# Patient Record
Sex: Male | Born: 2000 | Race: Black or African American | Hispanic: No | Marital: Single | Smoking: Current every day smoker
Health system: Southern US, Community
[De-identification: ages and names within clinical notes are randomized; demographics above are authoritative.]

## PROBLEM LIST (undated history)

## (undated) DIAGNOSIS — J45909 Unspecified asthma, uncomplicated: Secondary | ICD-10-CM

## (undated) HISTORY — PX: DENTAL RESTORATION/EXTRACTION WITH X-RAY: SHX5796

---
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
## 2019-06-27 ENCOUNTER — Encounter (HOSPITAL_COMMUNITY): Payer: Self-pay

## 2019-06-27 ENCOUNTER — Other Ambulatory Visit: Payer: Self-pay

## 2019-06-27 ENCOUNTER — Ambulatory Visit (HOSPITAL_COMMUNITY)
Admission: EM | Admit: 2019-06-27 | Discharge: 2019-06-27 | Disposition: A | Discharge status: Home / Self Care | Payer: Managed Care, Other (non HMO) | Attending: Physician Assistant | Admitting: Physician Assistant

## 2019-06-27 DIAGNOSIS — K0889 Other specified disorders of teeth and supporting structures: Secondary | ICD-10-CM

## 2019-06-27 MED ORDER — DICLOFENAC SODIUM 75 MG PO TBEC: 75.0000 mg | DELAYED_RELEASE_TABLET | Freq: Two times a day (BID) | ORAL | 0 refills | Status: DC

## 2019-06-27 MED ORDER — AMOXICILLIN 500 MG PO CAPS: 500.0000 mg | ORAL_CAPSULE | Freq: Three times a day (TID) | ORAL | 0 refills | Status: DC

## 2019-06-27 NOTE — ED Triage Notes (Signed)
Pt presents with bilateral dental pain from wisdom teeth coming in.

## 2019-06-27 NOTE — Discharge Instructions (Addendum)
Return if any problems.

## 2019-06-27 NOTE — ED Provider Notes (Signed)
[facility]    CSN: [id number] Arrival date & time: 06/27/19  1433      History   Chief Complaint Chief Complaint  Patient presents with  . Dental Pain    HPI Anthony Vance is a 18 y.o. male.   The history is provided by the patient. No language interpreter was used.  Dental Pain Location:  Upper and lower Upper teeth location:  1/RU 3rd molar and 16/LU 3rd molar Lower teeth location:  32/RL 3rd molar and 17/LL 3rd molar Quality:  Aching Severity:  Moderate Onset quality:  Gradual Timing:  Constant Progression:  Worsening Chronicity:  New Relieved by:  Nothing Worsened by:  Nothing Ineffective treatments:  None tried  Pt complains of pain from wisdom teeth.  History reviewed. No pertinent past medical history.  There are no active problems to display for this patient.   History reviewed. No pertinent surgical history.     Home Medications    Prior to Admission medications   Medication Sig Start Date End Date Taking? Authorizing Provider  amoxicillin (AMOXIL) 500 MG capsule Take 1 capsule (500 mg total) by mouth 3 (three) times daily. 06/27/19   Elson AreasSofia, Leslie K, PA-C  benzonatate (TESSALON) 100 MG capsule Take 1 capsule (100 mg total) by mouth every 8 (eight) hours. 10/08/18   Cathie HoopsYu, Amy V, PA-C  diclofenac (VOLTAREN) 75 MG EC tablet Take 1 tablet (75 mg total) by mouth 2 (two) times daily. 06/27/19   Elson AreasSofia, Leslie K, PA-C  doxycycline (VIBRAMYCIN) 100 MG capsule Take 1 capsule (100 mg total) by mouth 2 (two) times daily. 10/08/18   Cathie HoopsYu, Amy V, PA-C  fluticasone (FLONASE) 50 MCG/ACT nasal spray Place 2 sprays into both nostrils daily. 10/08/18   Cathie HoopsYu, Amy V, PA-C  ibuprofen (ADVIL,MOTRIN) 600 MG tablet Take 1 tablet (600 mg total) by mouth every 6 (six) hours as needed. 08/24/17   Joy, Shawn C, PA-C  ipratropium (ATROVENT) 0.06 % nasal spray Place 2 sprays into both nostrils 4 (four) times daily. 10/08/18   Cathie HoopsYu, Amy V, PA-C  predniSONE (DELTASONE) 50 MG tablet  Take 1 tablet (50 mg total) by mouth daily. 10/08/18   Belinda FisherYu, Amy V, PA-C    Family History Family History  Problem Relation Age of Onset  . Hypertension Other   . Diabetes Other     Social History Social History   Tobacco Use  . Smoking status: Passive Smoke Exposure - Never Smoker  . Smokeless tobacco: Never Used  Substance Use Topics  . Alcohol use: No  . Drug use: No     Allergies   Patient has no known allergies.   Review of Systems Review of Systems  HENT: Positive for dental problem.   All other systems reviewed and are negative.    Physical Exam Triage Vital Signs ED Triage Vitals  Enc Vitals Group     BP 06/27/19 1458 127/78     Pulse Rate 06/27/19 1458 60     Resp 06/27/19 1458 18     Temp 06/27/19 1458 98.7 F (37.1 C)     Temp Source 06/27/19 1458 Oral     SpO2 06/27/19 1458 100 %     Weight --      Height --      Head Circumference --      Peak Flow --      Pain Score 06/27/19 1459 10     Pain Loc --      Pain Edu? --  Excl. in GC? --    No data found.  Updated Vital Signs BP 127/78 (BP Location: Right Arm)   Pulse 60   Temp 98.7 F (37.1 C) (Oral)   Resp 18   SpO2 100%   Visual Acuity Right Eye Distance:   Left Eye Distance:   Bilateral Distance:    Right Eye Near:   Left Eye Near:    Bilateral Near:     Physical Exam Vitals signs and nursing note reviewed.  Constitutional:      Appearance: He is well-developed.  HENT:     Head: Normocephalic.     Mouth/Throat:     Mouth: Mucous membranes are moist.     Comments: Swelling around 3rd molars.  Neck:     Musculoskeletal: Normal range of motion.  Cardiovascular:     Rate and Rhythm: Normal rate.  Pulmonary:     Effort: Pulmonary effort is normal.  Abdominal:     General: There is no distension.  Musculoskeletal: Normal range of motion.  Neurological:     Mental Status: He is alert and oriented to person, place, and time.      UC Treatments / Results  Labs  (all labs ordered are listed, but only abnormal results are displayed) Labs Reviewed - No data to display  EKG   Radiology No results found.  Procedures Procedures (including critical care time)  Medications Ordered in UC Medications - No data to display  Initial Impression / Assessment and Plan / UC Course  I have reviewed the triage vital signs and the nursing notes.  Pertinent labs & imaging results that were available during my care of the patient were reviewed by me and considered in my medical decision making (see chart for details).      Final Clinical Impressions(s) / UC Diagnoses   Final diagnoses:  Toothache     Discharge Instructions     Return if any problems    ED Prescriptions    Medication Sig Dispense Auth. Provider   amoxicillin (AMOXIL) 500 MG capsule Take 1 capsule (500 mg total) by mouth 3 (three) times daily. 30 capsule Sofia, Leslie K, [place]   diclofenac (VOLTAREN) 75 MG EC tablet Take 1 tablet (75 mg total) by mouth 2 (two) times daily. 20 tablet Fransico Meadow, [place]     Controlled Substance Prescriptions [place] Controlled Substance Registry consulted? Not Applicable  An After Visit Summary was printed and given to the patient.    Fransico Meadow, [place] 06/27/19 1537

## 2020-05-19 ENCOUNTER — Encounter (HOSPITAL_COMMUNITY): Payer: Self-pay

## 2020-05-19 ENCOUNTER — Other Ambulatory Visit: Payer: Self-pay

## 2020-05-19 ENCOUNTER — Ambulatory Visit (HOSPITAL_COMMUNITY)
Admission: EM | Admit: 2020-05-19 | Discharge: 2020-05-19 | Disposition: A | Discharge status: Home / Self Care | Payer: 59 | Attending: Family Medicine | Admitting: Family Medicine

## 2020-05-19 DIAGNOSIS — G8929 Other chronic pain: Secondary | ICD-10-CM

## 2020-05-19 DIAGNOSIS — M25562 Pain in left knee: Secondary | ICD-10-CM

## 2020-05-19 MED ORDER — NAPROXEN 500 MG PO TABS: 500.0000 mg | ORAL_TABLET | Freq: Two times a day (BID) | ORAL | 0 refills | Status: DC
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
## 2020-05-19 NOTE — Discharge Instructions (Addendum)
Rest, ice, wear the knee sleeve Stretch the leg.  Naproxen for pain Follow up with sports med as needed.

## 2021-02-08 ENCOUNTER — Encounter (HOSPITAL_COMMUNITY): Payer: Self-pay

## 2021-02-08 ENCOUNTER — Emergency Department (HOSPITAL_COMMUNITY)
Admission: EM | Admit: 2021-02-08 | Discharge: 2021-02-09 | Disposition: A | Discharge status: Home / Self Care | Payer: BC Managed Care – PPO | Attending: Emergency Medicine | Admitting: Emergency Medicine

## 2021-02-08 ENCOUNTER — Emergency Department (HOSPITAL_COMMUNITY): Payer: BC Managed Care – PPO

## 2021-02-08 ENCOUNTER — Other Ambulatory Visit: Payer: Self-pay

## 2021-02-08 DIAGNOSIS — R110 Nausea: Secondary | ICD-10-CM | POA: Diagnosis not present

## 2021-02-08 DIAGNOSIS — M549 Dorsalgia, unspecified: Secondary | ICD-10-CM | POA: Diagnosis not present

## 2021-02-08 DIAGNOSIS — Y92410 Unspecified street and highway as the place of occurrence of the external cause: Secondary | ICD-10-CM | POA: Insufficient documentation

## 2021-02-08 DIAGNOSIS — J45909 Unspecified asthma, uncomplicated: Secondary | ICD-10-CM | POA: Diagnosis not present

## 2021-02-08 DIAGNOSIS — S301XXA Contusion of abdominal wall, initial encounter: Secondary | ICD-10-CM | POA: Diagnosis not present

## 2021-02-08 DIAGNOSIS — F17290 Nicotine dependence, other tobacco product, uncomplicated: Secondary | ICD-10-CM | POA: Insufficient documentation

## 2021-02-08 DIAGNOSIS — S3991XA Unspecified injury of abdomen, initial encounter: Secondary | ICD-10-CM | POA: Diagnosis present

## 2021-02-08 DIAGNOSIS — R079 Chest pain, unspecified: Secondary | ICD-10-CM | POA: Insufficient documentation

## 2021-02-08 DIAGNOSIS — S0990XA Unspecified injury of head, initial encounter: Secondary | ICD-10-CM | POA: Insufficient documentation

## 2021-02-08 DIAGNOSIS — R52 Pain, unspecified: Secondary | ICD-10-CM

## 2021-02-08 HISTORY — DX: Unspecified asthma, uncomplicated: J45.909

## 2021-02-08 LAB — HEPATIC FUNCTION PANEL
ALT: 40 U/L (ref 0–44)
AST: 32 U/L (ref 15–41)
Albumin: 4.3 g/dL (ref 3.5–5.0)
Alkaline Phosphatase: 56 U/L (ref 38–126)
Bilirubin, Direct: 0.2 mg/dL (ref 0.0–0.2)
Indirect Bilirubin: 0.2 mg/dL — ABNORMAL LOW (ref 0.3–0.9)
Total Bilirubin: 0.4 mg/dL (ref 0.3–1.2)
Total Protein: 6.8 g/dL (ref 6.5–8.1)

## 2021-02-08 LAB — CBC WITH DIFFERENTIAL/PLATELET
Abs Immature Granulocytes: 0.02 10*3/uL (ref 0.00–0.07)
Basophils Absolute: 0 10*3/uL (ref 0.0–0.1)
Basophils Relative: 1 %
Eosinophils Absolute: 0 10*3/uL (ref 0.0–0.5)
Eosinophils Relative: 0 %
HCT: 50.7 % (ref 39.0–52.0)
Hemoglobin: 17.8 g/dL — ABNORMAL HIGH (ref 13.0–17.0)
Immature Granulocytes: 0 %
Lymphocytes Relative: 38 %
Lymphs Abs: 2.3 10*3/uL (ref 0.7–4.0)
MCH: 32 pg (ref 26.0–34.0)
MCHC: 35.1 g/dL (ref 30.0–36.0)
MCV: 91.2 fL (ref 80.0–100.0)
Monocytes Absolute: 0.3 10*3/uL (ref 0.1–1.0)
Monocytes Relative: 6 %
Neutro Abs: 3.3 10*3/uL (ref 1.7–7.7)
Neutrophils Relative %: 55 %
Platelets: 240 10*3/uL (ref 150–400)
RBC: 5.56 MIL/uL (ref 4.22–5.81)
RDW: 12.3 % (ref 11.5–15.5)
WBC: 6 10*3/uL (ref 4.0–10.5)
nRBC: 0 % (ref 0.0–0.2)

## 2021-02-08 LAB — BASIC METABOLIC PANEL
Anion gap: 6 (ref 5–15)
BUN: 16 mg/dL (ref 6–20)
CO2: 27 mmol/L (ref 22–32)
Calcium: 9.3 mg/dL (ref 8.9–10.3)
Chloride: 105 mmol/L (ref 98–111)
Creatinine, Ser: 1.13 mg/dL (ref 0.61–1.24)
GFR, Estimated: >60 mL/min (ref >60)
Glucose, Bld: 126 mg/dL — ABNORMAL HIGH (ref 70–99)
Potassium: 3.8 mmol/L (ref 3.5–5.1)
Sodium: 138 mmol/L (ref 135–145)

## 2021-02-08 LAB — LIPASE, BLOOD: Lipase: 64 U/L — ABNORMAL HIGH (ref 11–51)

## 2021-02-08 MED ORDER — IOHEXOL 300 MG/ML  SOLN
100.0000 mL | Freq: Once | INTRAMUSCULAR | Status: AC | PRN
  Administered 2021-02-08: 100 mL via INTRAVENOUS

## 2021-02-08 MED ORDER — ONDANSETRON HCL 4 MG/2ML IJ SOLN
4.0000 mg | Freq: Once | INTRAMUSCULAR | Status: AC
  Administered 2021-02-08: 4 mg via INTRAVENOUS
  Filled 2021-02-08: qty 2

## 2021-02-08 MED ORDER — MORPHINE SULFATE (PF) 4 MG/ML IV SOLN
4.0000 mg | Freq: Once | INTRAVENOUS | Status: AC
  Administered 2021-02-08: 4 mg via INTRAVENOUS
  Filled 2021-02-08: qty 1

## 2021-02-08 NOTE — ED Notes (Signed)
Pt transported to radiology.
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
## 2021-02-09 MED ORDER — METHOCARBAMOL 500 MG PO TABS: 500.0000 mg | ORAL_TABLET | Freq: Three times a day (TID) | ORAL | 0 refills | Status: DC | PRN

## 2021-02-09 MED ORDER — IBUPROFEN 800 MG PO TABS: 800.0000 mg | ORAL_TABLET | Freq: Three times a day (TID) | ORAL | 0 refills | Status: AC

## 2021-02-09 NOTE — ED Notes (Signed)
Pt  Has eaten and drunk successfully.  PT walked around the  [place] without assistance.  PT has a significant limp related to rt knee pain.
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
## 2021-02-09 NOTE — ED Notes (Signed)
DC instructions reviewed with pt. PT verbalized understanding. PT DC °

## 2021-02-24 ENCOUNTER — Emergency Department (HOSPITAL_COMMUNITY): Payer: BC Managed Care – PPO

## 2021-02-24 ENCOUNTER — Emergency Department (HOSPITAL_COMMUNITY)
Admission: EM | Admit: 2021-02-24 | Discharge: 2021-02-25 | Disposition: A | Discharge status: Home / Self Care | Payer: BC Managed Care – PPO | Attending: Emergency Medicine | Admitting: Emergency Medicine

## 2021-02-24 DIAGNOSIS — S8392XA Sprain of unspecified site of left knee, initial encounter: Secondary | ICD-10-CM | POA: Insufficient documentation

## 2021-02-24 DIAGNOSIS — M25462 Effusion, left knee: Secondary | ICD-10-CM | POA: Insufficient documentation

## 2021-02-24 DIAGNOSIS — J45909 Unspecified asthma, uncomplicated: Secondary | ICD-10-CM | POA: Diagnosis not present

## 2021-02-24 DIAGNOSIS — F17290 Nicotine dependence, other tobacco product, uncomplicated: Secondary | ICD-10-CM | POA: Diagnosis not present

## 2021-02-24 DIAGNOSIS — Y92410 Unspecified street and highway as the place of occurrence of the external cause: Secondary | ICD-10-CM | POA: Insufficient documentation

## 2021-02-24 DIAGNOSIS — S8992XA Unspecified injury of left lower leg, initial encounter: Secondary | ICD-10-CM | POA: Diagnosis present

## 2021-02-24 MED ORDER — KETOROLAC TROMETHAMINE 60 MG/2ML IM SOLN
60.0000 mg | Freq: Once | INTRAMUSCULAR | Status: AC
  Administered 2021-02-25: 60 mg via INTRAMUSCULAR
  Filled 2021-02-24: qty 2
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
## 2021-02-24 NOTE — Discharge Instructions (Signed)
Continue to take 2 ibuprofen and 2 tylenol at the same time every 6 hours for the pain and wear the brace for support.  Also use the crutches as needed.
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
## 2021-02-25 DIAGNOSIS — S8392XA Sprain of unspecified site of left knee, initial encounter: Secondary | ICD-10-CM | POA: Diagnosis not present

## 2021-02-25 NOTE — ED Notes (Signed)
Patient verbalizes understanding of discharge instructions. Opportunity for questioning and answers were provided. Armband removed by staff, pt discharged from ED ambulatory with crutches. ° °

## 2021-11-11 ENCOUNTER — Other Ambulatory Visit: Payer: Self-pay

## 2021-11-11 ENCOUNTER — Encounter (HOSPITAL_BASED_OUTPATIENT_CLINIC_OR_DEPARTMENT_OTHER): Payer: Self-pay

## 2021-11-11 DIAGNOSIS — J45909 Unspecified asthma, uncomplicated: Secondary | ICD-10-CM | POA: Diagnosis not present

## 2021-11-11 DIAGNOSIS — Z202 Contact with and (suspected) exposure to infections with a predominantly sexual mode of transmission: Secondary | ICD-10-CM | POA: Insufficient documentation

## 2021-11-11 DIAGNOSIS — Z7951 Long term (current) use of inhaled steroids: Secondary | ICD-10-CM | POA: Insufficient documentation

## 2021-11-11 NOTE — ED Triage Notes (Signed)
Pt reports STD exposure-denies sx-NAD-steady gait 

## 2021-11-12 ENCOUNTER — Emergency Department (HOSPITAL_BASED_OUTPATIENT_CLINIC_OR_DEPARTMENT_OTHER)
Admission: EM | Admit: 2021-11-12 | Discharge: 2021-11-12 | Disposition: A | Discharge status: Home / Self Care | Payer: BC Managed Care – PPO | Attending: Emergency Medicine | Admitting: Emergency Medicine

## 2021-11-12 DIAGNOSIS — Z202 Contact with and (suspected) exposure to infections with a predominantly sexual mode of transmission: Secondary | ICD-10-CM | POA: Diagnosis not present

## 2021-11-12 MED ORDER — AZITHROMYCIN 250 MG PO TABS
1000.0000 mg | ORAL_TABLET | Freq: Once | ORAL | Status: AC
  Administered 2021-11-12: 1000 mg via ORAL
  Filled 2021-11-12: qty 4

## 2021-11-12 MED ORDER — CEFTRIAXONE SODIUM 500 MG IJ SOLR
500.0000 mg | Freq: Once | INTRAMUSCULAR | Status: AC
  Administered 2021-11-12: 500 mg via INTRAMUSCULAR
  Filled 2021-11-12: qty 500

## 2021-11-12 NOTE — ED Notes (Signed)
Pt denies any symptoms at this time

## 2021-11-12 NOTE — Discharge Instructions (Signed)
No unprotected sexual activity for the next 2 weeks.  Return to the emergency department if you experience any new and/or concerning symptoms.

## 2021-11-12 NOTE — ED Provider Notes (Signed)
[facility] EMERGENCY DEPARTMENT Provider Note   CSN: [id number] Arrival date & time: 11/11/21  2236     History  Chief Complaint  Patient presents with   Exposure to STD    Anthony Vance is a 21 y.o. male.  Patient is a 21 year old male with history of asthma.  He presents today for STD exposure.  He was told earlier today by his recent sexual contact that she tested positive for gonorrhea and chlamydia.  Patient at this time is having no symptoms.  He denies any rash, discharge, dysuria, or other symptoms, but is requesting treatment for the above.  The history is provided by the patient.  Exposure to STD This is a new problem. The current episode started more than 2 days ago. Nothing aggravates the symptoms. Nothing relieves the symptoms.      Home Medications Prior to Admission medications   Medication Sig Start Date End Date Taking? Authorizing Provider  ibuprofen (ADVIL) 800 MG tablet Take 1 tablet (800 mg total) by mouth 3 (three) times daily. 02/09/21   Mesner, Barbara Cower, MD  ipratropium (ATROVENT) 0.06 % nasal spray Place 2 sprays into both nostrils 4 (four) times daily. 10/08/18   Cathie Hoops, Amy V, PA-C  methocarbamol (ROBAXIN) 500 MG tablet Take 1 tablet (500 mg total) by mouth every 8 (eight) hours as needed for muscle spasms. 02/09/21   Mesner, Barbara Cower, MD  naproxen (NAPROSYN) 500 MG tablet Take 1 tablet (500 mg total) by mouth 2 (two) times daily. 05/19/20   Bast, Gloris Manchester A, NP  fluticasone (FLONASE) 50 MCG/ACT nasal spray Place 2 sprays into both nostrils daily. 10/08/18 05/19/20  Belinda Fisher, PA-C      Allergies    Patient has no known allergies.    Review of Systems   Review of Systems  All other systems reviewed and are negative.  Physical Exam Updated Vital Signs BP (!) 152/74 (BP Location: Left Arm)    Pulse 67    Temp 98.4 F (36.9 C) (Oral)    Resp 16    Ht 5\' 11"  (1.803 m)    Wt 79.4 kg    SpO2 98%    BMI 24.41 kg/m  Physical Exam Vitals and nursing note  reviewed.  Constitutional:      Appearance: Normal appearance.  Pulmonary:     Effort: Pulmonary effort is normal.  Abdominal:     General: There is no distension.     Tenderness: There is no abdominal tenderness.  Genitourinary:    Penis: Normal.      Testes: Normal.  Skin:    General: Skin is warm and dry.  Neurological:     Mental Status: He is alert and oriented to person, place, and time.    ED Results / Procedures / Treatments   Labs (all labs ordered are listed, but only abnormal results are displayed) Labs Reviewed  GC/CHLAMYDIA PROBE AMP ([place]) NOT AT [facility]    EKG None  Radiology No results found.  Procedures Procedures    Medications Ordered in ED Medications  cefTRIAXone (ROCEPHIN) injection 500 mg (has no administration in time range)  azithromycin (ZITHROMAX) tablet 1,000 mg (has no administration in time range)    ED Course/ Medical Decision Making/ A&P  Patient presenting with concerns of STD exposure.  His sexual partner tested positive for chlamydia and gonorrhea.  Patient without symptoms at this time, but will be treated presumptively.  Patient given IM Rocephin and p.o. Zithromax.  Final Clinical Impression(s) /  ED Diagnoses Final diagnoses:  None    Rx / DC Orders ED Discharge Orders     None         Geoffery Lyons, MD 11/12/21 [phone]

## 2021-11-14 LAB — GC/CHLAMYDIA PROBE AMP (~~LOC~~) NOT AT ARMC
Chlamydia: POSITIVE — AB
Comment: NEGATIVE
Comment: NORMAL
Neisseria Gonorrhea: NEGATIVE
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
## 2023-03-02 ENCOUNTER — Ambulatory Visit
Admission: EM | Admit: 2023-03-02 | Discharge: 2023-03-02 | Disposition: A | Discharge status: Home / Self Care | Payer: BC Managed Care – PPO | Attending: Internal Medicine | Admitting: Internal Medicine

## 2023-03-02 DIAGNOSIS — Z113 Encounter for screening for infections with a predominantly sexual mode of transmission: Secondary | ICD-10-CM | POA: Insufficient documentation

## 2023-03-02 NOTE — Discharge Instructions (Signed)
Your STD testing is pending.  Will call if it is abnormal and treat as appropriate.  We ask that you refrain from sexual activity until test results and treatment are complete.  Follow-up with any further concerns.

## 2023-03-02 NOTE — ED Triage Notes (Signed)
Patient states he wants an STD check. Patient denies having any symptoms.

## 2023-03-02 NOTE — ED Provider Notes (Signed)
[facility]    CSN: [id number] Arrival date & time: 03/02/23  1734      History   Chief Complaint Chief Complaint  Patient presents with   STD check    HPI Dylan Traver is a 22 y.o. male.   Patient presents today for routine STD testing.  Denies any exposure or any associated symptoms.     Past Medical History:  Diagnosis Date   Asthma     There are no problems to display for this patient.   History reviewed. No pertinent surgical history.     Home Medications    Prior to Admission medications   Medication Sig Start Date End Date Taking? Authorizing Provider  ibuprofen (ADVIL) 800 MG tablet Take 1 tablet (800 mg total) by mouth 3 (three) times daily. 02/09/21   Mesner, Barbara Cower, MD  ipratropium (ATROVENT) 0.06 % nasal spray Place 2 sprays into both nostrils 4 (four) times daily. 10/08/18   Cathie Hoops, Amy V, PA-C  methocarbamol (ROBAXIN) 500 MG tablet Take 1 tablet (500 mg total) by mouth every 8 (eight) hours as needed for muscle spasms. 02/09/21   Mesner, Barbara Cower, MD  naproxen (NAPROSYN) 500 MG tablet Take 1 tablet (500 mg total) by mouth 2 (two) times daily. 05/19/20   Bast, Gloris Manchester A, NP  fluticasone (FLONASE) 50 MCG/ACT nasal spray Place 2 sprays into both nostrils daily. 10/08/18 05/19/20  Belinda Fisher, PA-C    Family History Family History  Problem Relation Age of Onset   Hypertension Other    Diabetes Other     Social History Social History   Tobacco Use   Smoking status: Every Day    Types: Cigars   Smokeless tobacco: Never   Tobacco comments:    black and milds  Vaping Use   Vaping Use: Never used  Substance Use Topics   Alcohol use: Yes    Comment: occ   Drug use: No     Allergies   Patient has no known allergies.   Review of Systems Review of Systems Per HPI  Physical Exam Triage Vital Signs ED Triage Vitals  Enc Vitals Group     BP 03/02/23 1744 136/82     Pulse Rate 03/02/23 1744 75     Resp 03/02/23 1744 16     Temp  03/02/23 1744 98.2 F (36.8 C)     Temp Source 03/02/23 1744 Oral     SpO2 03/02/23 1744 96 %     Weight --      Height --      Head Circumference --      Peak Flow --      Pain Score 03/02/23 1745 0     Pain Loc --      Pain Edu? --      Excl. in GC? --    No data found.  Updated Vital Signs BP 136/82 (BP Location: Left Arm)   Pulse 75   Temp 98.2 F (36.8 C) (Oral)   Resp 16   SpO2 96%   Visual Acuity Right Eye Distance:   Left Eye Distance:   Bilateral Distance:    Right Eye Near:   Left Eye Near:    Bilateral Near:     Physical Exam Constitutional:      General: He is not in acute distress.    Appearance: Normal appearance. He is not toxic-appearing or diaphoretic.  HENT:     Head: Normocephalic and atraumatic.  Eyes:  Extraocular Movements: Extraocular movements intact.     Conjunctiva/sclera: Conjunctivae normal.  Pulmonary:     Effort: Pulmonary effort is normal.  Genitourinary:    Comments: Deferred with shared decision making.  Self swab performed. Neurological:     General: No focal deficit present.     Mental Status: He is alert and oriented to person, place, and time. Mental status is at baseline.  Psychiatric:        Mood and Affect: Mood normal.        Behavior: Behavior normal.        Thought Content: Thought content normal.        Judgment: Judgment normal.      UC Treatments / Results  Labs (all labs ordered are listed, but only abnormal results are displayed) Labs Reviewed  RPR  HIV ANTIBODY (ROUTINE TESTING W REFLEX)  CYTOLOGY, (ORAL, ANAL, URETHRAL) ANCILLARY ONLY    EKG   Radiology No results found.  Procedures Procedures (including critical care time)  Medications Ordered in UC Medications - No data to display  Initial Impression / Assessment and Plan / UC Course  I have reviewed the triage vital signs and the nursing notes.  Pertinent labs & imaging results that were available during my care of the patient were  reviewed by me and considered in my medical decision making (see chart for details).     Patient is here for routine STD testing so cytology swab, HIV, RPR test pending.  No confirmed exposure to STD, so will await results for any treatment if necessary.  Advised follow-up with any further concerns.  Patient verbalized understanding and was agreeable with plan. Final Clinical Impressions(s) / UC Diagnoses   Final diagnoses:  Screening examination for venereal disease     Discharge Instructions      Your STD testing is pending.  Will call if it is abnormal and treat as appropriate.  We ask that you refrain from sexual activity until test results and treatment are complete.  Follow-up with any further concerns.    ED Prescriptions   None    PDMP not reviewed this encounter.   Gustavus Bryant, [place] 03/02/23 [phone]

## 2023-03-05 LAB — CYTOLOGY, (ORAL, ANAL, URETHRAL) ANCILLARY ONLY
Chlamydia: NEGATIVE
Comment: NEGATIVE
Comment: NEGATIVE
Comment: NORMAL
Neisseria Gonorrhea: NEGATIVE
Trichomonas: NEGATIVE

## 2023-03-05 LAB — HIV ANTIBODY (ROUTINE TESTING W REFLEX): HIV Screen 4th Generation wRfx: NONREACTIVE

## 2023-03-05 LAB — RPR: RPR Ser Ql: NONREACTIVE

## 2023-04-21 ENCOUNTER — Emergency Department (HOSPITAL_COMMUNITY): Payer: 59

## 2023-04-21 ENCOUNTER — Encounter (HOSPITAL_COMMUNITY): Payer: Self-pay | Admitting: *Deleted

## 2023-04-21 ENCOUNTER — Other Ambulatory Visit: Payer: Self-pay

## 2023-04-21 ENCOUNTER — Emergency Department (HOSPITAL_COMMUNITY)
Admission: EM | Admit: 2023-04-21 | Discharge: 2023-04-21 | Disposition: A | Discharge status: Home / Self Care | Payer: 59 | Attending: Emergency Medicine | Admitting: Emergency Medicine

## 2023-04-21 DIAGNOSIS — S82141A Displaced bicondylar fracture of right tibia, initial encounter for closed fracture: Secondary | ICD-10-CM | POA: Insufficient documentation

## 2023-04-21 DIAGNOSIS — J45909 Unspecified asthma, uncomplicated: Secondary | ICD-10-CM | POA: Insufficient documentation

## 2023-04-21 DIAGNOSIS — S8991XA Unspecified injury of right lower leg, initial encounter: Secondary | ICD-10-CM | POA: Diagnosis present

## 2023-04-21 MED ORDER — OXYCODONE-ACETAMINOPHEN 5-325 MG PO TABS
1.0000 | ORAL_TABLET | Freq: Once | ORAL | Status: AC
  Administered 2023-04-21: 1 via ORAL
  Filled 2023-04-21: qty 1

## 2023-04-21 MED ORDER — OXYCODONE-ACETAMINOPHEN 5-325 MG PO TABS: 1.0000 | ORAL_TABLET | Freq: Four times a day (QID) | ORAL | 0 refills | Status: AC | PRN

## 2023-04-21 MED ORDER — KETOROLAC TROMETHAMINE 60 MG/2ML IM SOLN
30.0000 mg | Freq: Once | INTRAMUSCULAR | Status: AC
  Administered 2023-04-21: 30 mg via INTRAMUSCULAR
  Filled 2023-04-21: qty 2

## 2023-04-21 NOTE — ED Notes (Signed)
Patient verbalizes understanding of discharge instructions. Opportunity for questioning and answers were provided. Armband removed by staff, pt discharged from ED. Pt taken to ED waiting room via wheel chair.  

## 2023-04-21 NOTE — Discharge Instructions (Signed)
Keep knee immobilizer on when up and out of bed.  You can put weight on your leg, within the tolerance of your own pain.  Use crutches as needed.  Take ibuprofen and Tylenol for pain.  A prescription for narcotic pain medication was sent to your pharmacy.  Take as needed.  Call either number below to set up a follow-up appointment with an orthopedic doctor for reevaluation.  Return to the emergency department for any new or worsening symptoms of concern.
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
## 2023-05-05 ENCOUNTER — Other Ambulatory Visit: Payer: Self-pay

## 2023-05-05 ENCOUNTER — Ambulatory Visit
Admission: EM | Admit: 2023-05-05 | Discharge: 2023-05-05 | Disposition: A | Discharge status: Home / Self Care | Payer: BC Managed Care – PPO | Attending: Internal Medicine | Admitting: Internal Medicine

## 2023-05-05 ENCOUNTER — Encounter: Payer: Self-pay | Admitting: Emergency Medicine

## 2023-05-05 DIAGNOSIS — S82141A Displaced bicondylar fracture of right tibia, initial encounter for closed fracture: Secondary | ICD-10-CM | POA: Diagnosis not present

## 2023-05-05 MED ORDER — TRAMADOL HCL 50 MG PO TABS: 50.0000 mg | ORAL_TABLET | Freq: Four times a day (QID) | ORAL | 0 refills | Status: DC | PRN
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
## 2023-05-05 NOTE — ED Provider Notes (Signed)
[facility]    CSN: [id number] Arrival date & time: 05/05/23  1314      History   Chief Complaint Chief Complaint  Patient presents with   Leg Pain    HPI Anthony Vance is a 22 y.o. male who was recently diagnosed with right tibial plateau fracture comes to the urgent care with persistent right knee pain.  Patient was advised to follow-up with the orthopedic surgery team but he has not made that appointment yet.  He is ran out of his medication and is complaining of persistent right knee pain.  Pain is throbbing, sharp and constant.  Pain is aggravated by movement but denies any relieving factors.  Mild swelling of the right knee. HPI  Past Medical History:  Diagnosis Date   Asthma     There are no problems to display for this patient.   History reviewed. No pertinent surgical history.     Home Medications    Prior to Admission medications   Medication Sig Start Date End Date Taking? Authorizing Provider  traMADol (ULTRAM) 50 MG tablet Take 1 tablet (50 mg total) by mouth every 6 (six) hours as needed. 05/05/23  Yes Roseann Kees, Britta Mccreedy, MD  ibuprofen (ADVIL) 800 MG tablet Take 1 tablet (800 mg total) by mouth 3 (three) times daily. 02/09/21   Mesner, Barbara Cower, MD  ipratropium (ATROVENT) 0.06 % nasal spray Place 2 sprays into both nostrils 4 (four) times daily. 10/08/18   Cathie Hoops, Amy V, PA-C  methocarbamol (ROBAXIN) 500 MG tablet Take 1 tablet (500 mg total) by mouth every 8 (eight) hours as needed for muscle spasms. 02/09/21   Mesner, Barbara Cower, MD  naproxen (NAPROSYN) 500 MG tablet Take 1 tablet (500 mg total) by mouth 2 (two) times daily. 05/19/20   Bast, Gloris Manchester A, NP  fluticasone (FLONASE) 50 MCG/ACT nasal spray Place 2 sprays into both nostrils daily. 10/08/18 05/19/20  Belinda Fisher, PA-C    Family History Family History  Problem Relation Age of Onset   Hypertension Other    Diabetes Other     Social History Social History   Tobacco Use   Smoking status: Every Day     Types: Cigars   Smokeless tobacco: Never   Tobacco comments:    black and milds  Vaping Use   Vaping Use: Never used  Substance Use Topics   Alcohol use: Yes    Comment: occ   Drug use: No     Allergies   Patient has no known allergies.   Review of Systems Review of Systems As per HPI   Physical Exam Triage Vital Signs ED Triage Vitals  Enc Vitals Group     BP 05/05/23 1327 (!) 156/67     Pulse Rate 05/05/23 1327 100     Resp 05/05/23 1327 18     Temp 05/05/23 1327 98 F (36.7 C)     Temp Source 05/05/23 1327 Oral     SpO2 05/05/23 1327 97 %     Weight --      Height --      Head Circumference --      Peak Flow --      Pain Score 05/05/23 1328 10     Pain Loc --      Pain Edu? --      Excl. in GC? --    No data found.  Updated Vital Signs BP (!) 156/67 (BP Location: Left Arm)   Pulse 100   Temp 98  F (36.7 C) (Oral)   Resp 18   SpO2 97%   Visual Acuity Right Eye Distance:   Left Eye Distance:   Bilateral Distance:    Right Eye Near:   Left Eye Near:    Bilateral Near:     Physical Exam Vitals and nursing note reviewed.  Constitutional:      General: He is in acute distress.     Appearance: He is not ill-appearing, toxic-appearing or diaphoretic.  Cardiovascular:     Rate and Rhythm: Normal rate and regular rhythm.  Pulmonary:     Effort: Pulmonary effort is normal.     Breath sounds: Normal breath sounds.  Musculoskeletal:     Comments: Range of motion of the right knee is limited secondary to pain.  No bruising of the right knee.  Neurological:     Mental Status: He is alert.      UC Treatments / Results  Labs (all labs ordered are listed, but only abnormal results are displayed) Labs Reviewed - No data to display  EKG   Radiology No results found.  Procedures Procedures (including critical care time)  Medications Ordered in UC Medications - No data to display  Initial Impression / Assessment and Plan / UC Course  I  have reviewed the triage vital signs and the nursing notes.  Pertinent labs & imaging results that were available during my care of the patient were reviewed by me and considered in my medical decision making (see chart for details).     1.  Right tibial plateau fracture, closed: Patient is advised to follow-up with orthopedic surgery immediately Tramadol as needed for pain Nonweightbearing until patient follows up with orthopedic surgery If patient's symptoms worsen, he is advised to reach out to orthopedic surgery sooner. Final Clinical Impressions(s) / UC Diagnoses   Final diagnoses:  Tibial plateau fracture, right, closed, initial encounter     Discharge Instructions      Please take medications as directed No weightbearing on the right knee Continue using your crutches Please follow-up with orthopedic surgery as soon as possible If you have worsening symptoms please call the orthopedic surgery office sooner.   ED Prescriptions     Medication Sig Dispense Auth. Provider   traMADol (ULTRAM) 50 MG tablet Take 1 tablet (50 mg total) by mouth every 6 (six) hours as needed. 15 tablet Zain Lankford, Britta Mccreedy, MD      I have reviewed the PDMP during this encounter.   Merrilee Jansky, MD 05/05/23 [phone]

## 2023-05-05 NOTE — Discharge Instructions (Addendum)
Please take medications as directed No weightbearing on the right knee Continue using your crutches Please follow-up with orthopedic surgery as soon as possible If you have worsening symptoms please call the orthopedic surgery office sooner.

## 2023-07-13 ENCOUNTER — Other Ambulatory Visit: Payer: Self-pay

## 2023-07-13 ENCOUNTER — Encounter (HOSPITAL_BASED_OUTPATIENT_CLINIC_OR_DEPARTMENT_OTHER): Payer: Self-pay | Admitting: Orthopedic Surgery

## 2023-07-19 ENCOUNTER — Encounter (HOSPITAL_BASED_OUTPATIENT_CLINIC_OR_DEPARTMENT_OTHER): Payer: Self-pay | Admitting: Orthopedic Surgery

## 2023-07-20 ENCOUNTER — Other Ambulatory Visit: Payer: Self-pay

## 2023-07-20 ENCOUNTER — Encounter (HOSPITAL_BASED_OUTPATIENT_CLINIC_OR_DEPARTMENT_OTHER): Payer: Self-pay | Admitting: Orthopedic Surgery

## 2023-07-20 ENCOUNTER — Encounter (HOSPITAL_BASED_OUTPATIENT_CLINIC_OR_DEPARTMENT_OTHER)
Admission: RE | Disposition: A | Discharge status: Home / Self Care | Payer: Self-pay | Source: Home / Self Care | Attending: Orthopedic Surgery

## 2023-07-20 ENCOUNTER — Ambulatory Visit (HOSPITAL_BASED_OUTPATIENT_CLINIC_OR_DEPARTMENT_OTHER): Payer: 59 | Admitting: Certified Registered Nurse Anesthetist

## 2023-07-20 ENCOUNTER — Ambulatory Visit (HOSPITAL_BASED_OUTPATIENT_CLINIC_OR_DEPARTMENT_OTHER)
Admission: RE | Admit: 2023-07-20 | Discharge: 2023-07-20 | Disposition: A | Discharge status: Home / Self Care | Payer: 59 | Attending: Orthopedic Surgery | Admitting: Orthopedic Surgery

## 2023-07-20 DIAGNOSIS — S83511A Sprain of anterior cruciate ligament of right knee, initial encounter: Secondary | ICD-10-CM | POA: Insufficient documentation

## 2023-07-20 DIAGNOSIS — S83281A Other tear of lateral meniscus, current injury, right knee, initial encounter: Secondary | ICD-10-CM | POA: Insufficient documentation

## 2023-07-20 DIAGNOSIS — S83231A Complex tear of medial meniscus, current injury, right knee, initial encounter: Secondary | ICD-10-CM | POA: Diagnosis not present

## 2023-07-20 DIAGNOSIS — J45909 Unspecified asthma, uncomplicated: Secondary | ICD-10-CM | POA: Insufficient documentation

## 2023-07-20 DIAGNOSIS — F17290 Nicotine dependence, other tobacco product, uncomplicated: Secondary | ICD-10-CM | POA: Insufficient documentation

## 2023-07-20 DIAGNOSIS — X500XXA Overexertion from strenuous movement or load, initial encounter: Secondary | ICD-10-CM | POA: Insufficient documentation

## 2023-07-20 DIAGNOSIS — Y9389 Activity, other specified: Secondary | ICD-10-CM | POA: Diagnosis not present

## 2023-07-20 HISTORY — PX: KNEE ARTHROSCOPY WITH MENISCAL REPAIR: SHX5653

## 2023-07-20 SURGERY — KNEE ARTHROSCOPY WITH ANTERIOR CRUCIATE LIGAMENT (ACL) RECONSTRUCTION WITH HAMSTRING GRAFT
Anesthesia: Regional | Site: Knee | Laterality: Right

## 2023-07-20 MED ORDER — FENTANYL CITRATE (PF) 100 MCG/2ML IJ SOLN
25.0000 ug | INTRAMUSCULAR | Status: DC | PRN
  Administered 2023-07-20 (×3): 50 ug via INTRAVENOUS

## 2023-07-20 MED ORDER — FENTANYL CITRATE (PF) 100 MCG/2ML IJ SOLN
INTRAMUSCULAR | Status: DC | PRN
  Administered 2023-07-20 (×2): 50 ug via INTRAVENOUS

## 2023-07-20 MED ORDER — DEXMEDETOMIDINE HCL IN NACL 80 MCG/20ML IV SOLN
INTRAVENOUS | Status: DC | PRN
Start: 2023-07-20 — End: 2023-07-20
  Administered 2023-07-20 (×6): 4 ug via INTRAVENOUS

## 2023-07-20 MED ORDER — FENTANYL CITRATE (PF) 100 MCG/2ML IJ SOLN
INTRAMUSCULAR | Status: AC
  Filled 2023-07-20: qty 2

## 2023-07-20 MED ORDER — DEXMEDETOMIDINE HCL IN NACL 80 MCG/20ML IV SOLN
INTRAVENOUS | Status: AC
  Filled 2023-07-20: qty 20

## 2023-07-20 MED ORDER — PROPOFOL 10 MG/ML IV BOLUS
INTRAVENOUS | Status: AC
  Filled 2023-07-20: qty 20

## 2023-07-20 MED ORDER — ACETAMINOPHEN 500 MG PO TABS
1000.0000 mg | ORAL_TABLET | Freq: Once | ORAL | Status: AC
  Administered 2023-07-20: 1000 mg via ORAL

## 2023-07-20 MED ORDER — OXYCODONE HCL 5 MG PO TABS
5.0000 mg | ORAL_TABLET | Freq: Once | ORAL | Status: AC | PRN
  Administered 2023-07-20: 5 mg via ORAL

## 2023-07-20 MED ORDER — MIDAZOLAM HCL 2 MG/2ML IJ SOLN
2.0000 mg | Freq: Once | INTRAMUSCULAR | Status: AC
  Administered 2023-07-20: 2 mg via INTRAVENOUS

## 2023-07-20 MED ORDER — OXYCODONE HCL 5 MG PO TABS
ORAL_TABLET | ORAL | Status: AC
  Filled 2023-07-20: qty 1

## 2023-07-20 MED ORDER — GLYCOPYRROLATE 0.2 MG/ML IJ SOLN
INTRAMUSCULAR | Status: AC
  Filled 2023-07-20: qty 1

## 2023-07-20 MED ORDER — ACETAMINOPHEN 160 MG/5ML PO SOLN: 325.0000 mg | ORAL | Status: DC | PRN

## 2023-07-20 MED ORDER — MIDAZOLAM HCL 2 MG/2ML IJ SOLN
INTRAMUSCULAR | Status: AC
  Filled 2023-07-20: qty 2

## 2023-07-20 MED ORDER — LIDOCAINE 2% (20 MG/ML) 5 ML SYRINGE
INTRAMUSCULAR | Status: AC
  Filled 2023-07-20: qty 5

## 2023-07-20 MED ORDER — MIDAZOLAM HCL 2 MG/2ML IJ SOLN
INTRAMUSCULAR | Status: DC | PRN
Start: 2023-07-20 — End: 2023-07-20
  Administered 2023-07-20 (×2): 1 mg via INTRAVENOUS

## 2023-07-20 MED ORDER — ARTIFICIAL TEARS OPHTHALMIC OINT
TOPICAL_OINTMENT | OPHTHALMIC | Status: AC
  Filled 2023-07-20: qty 3.5

## 2023-07-20 MED ORDER — OXYCODONE HCL 5 MG PO TABS: 5.0000 mg | ORAL_TABLET | ORAL | 0 refills | Status: AC | PRN

## 2023-07-20 MED ORDER — CEFAZOLIN SODIUM-DEXTROSE 2-4 GM/100ML-% IV SOLN
2.0000 g | INTRAVENOUS | Status: AC
  Administered 2023-07-20: 2 g via INTRAVENOUS

## 2023-07-20 MED ORDER — KETOROLAC TROMETHAMINE 30 MG/ML IJ SOLN
INTRAMUSCULAR | Status: AC
  Filled 2023-07-20: qty 1

## 2023-07-20 MED ORDER — DEXAMETHASONE SODIUM PHOSPHATE 10 MG/ML IJ SOLN
INTRAMUSCULAR | Status: AC
  Filled 2023-07-20: qty 1

## 2023-07-20 MED ORDER — ACETAMINOPHEN 500 MG PO TABS
ORAL_TABLET | ORAL | Status: AC
  Filled 2023-07-20: qty 2

## 2023-07-20 MED ORDER — OXYCODONE HCL 5 MG/5ML PO SOLN: 5.0000 mg | Freq: Once | ORAL | Status: AC | PRN

## 2023-07-20 MED ORDER — DEXAMETHASONE SODIUM PHOSPHATE 10 MG/ML IJ SOLN
INTRAMUSCULAR | Status: DC | PRN
Start: 2023-07-20 — End: 2023-07-20
  Administered 2023-07-20: 10 mg via INTRAVENOUS

## 2023-07-20 MED ORDER — DROPERIDOL 2.5 MG/ML IJ SOLN: 0.6250 mg | Freq: Once | INTRAMUSCULAR | Status: DC | PRN

## 2023-07-20 MED ORDER — FENTANYL CITRATE (PF) 100 MCG/2ML IJ SOLN
100.0000 ug | Freq: Once | INTRAMUSCULAR | Status: AC
  Administered 2023-07-20: 50 ug via INTRAVENOUS

## 2023-07-20 MED ORDER — ONDANSETRON HCL 4 MG PO TABS: 4.0000 mg | ORAL_TABLET | Freq: Three times a day (TID) | ORAL | 0 refills | Status: AC | PRN

## 2023-07-20 MED ORDER — LACTATED RINGERS IV SOLN: INTRAVENOUS | Status: DC

## 2023-07-20 MED ORDER — ONDANSETRON HCL 4 MG/2ML IJ SOLN
INTRAMUSCULAR | Status: DC | PRN
Start: 2023-07-20 — End: 2023-07-20
  Administered 2023-07-20: 4 mg via INTRAVENOUS

## 2023-07-20 MED ORDER — ACETAMINOPHEN 325 MG PO TABS: 325.0000 mg | ORAL_TABLET | ORAL | Status: DC | PRN

## 2023-07-20 MED ORDER — ONDANSETRON HCL 4 MG/2ML IJ SOLN
INTRAMUSCULAR | Status: AC
  Filled 2023-07-20: qty 2

## 2023-07-20 MED ORDER — PROMETHAZINE HCL 25 MG/ML IJ SOLN: 6.2500 mg | INTRAMUSCULAR | Status: DC | PRN

## 2023-07-20 MED ORDER — CEFAZOLIN SODIUM-DEXTROSE 2-4 GM/100ML-% IV SOLN
INTRAVENOUS | Status: AC
  Filled 2023-07-20: qty 100

## 2023-07-20 MED ORDER — SODIUM CHLORIDE 0.9 % IR SOLN
Status: DC | PRN
  Administered 2023-07-20: 9000 mL

## 2023-07-20 MED ORDER — BUPIVACAINE-EPINEPHRINE (PF) 0.5% -1:200000 IJ SOLN
INTRAMUSCULAR | Status: DC | PRN
Start: 2023-07-20 — End: 2023-07-20
  Administered 2023-07-20: 30 mL via PERINEURAL

## 2023-07-20 MED ORDER — KETOROLAC TROMETHAMINE 30 MG/ML IJ SOLN
INTRAMUSCULAR | Status: DC | PRN
Start: 2023-07-20 — End: 2023-07-20
  Administered 2023-07-20: 30 mg via INTRAVENOUS

## 2023-07-20 MED ORDER — GLYCOPYRROLATE 0.2 MG/ML IJ SOLN
INTRAMUSCULAR | Status: DC | PRN
Start: 2023-07-20 — End: 2023-07-20
  Administered 2023-07-20 (×2): .1 mg via INTRAVENOUS

## 2023-07-20 MED ORDER — ARTIFICIAL TEARS OPHTHALMIC OINT
TOPICAL_OINTMENT | OPHTHALMIC | Status: DC | PRN
  Administered 2023-07-20: 1 via OPHTHALMIC

## 2023-07-20 MED ORDER — ACETAMINOPHEN 10 MG/ML IV SOLN: 1000.0000 mg | Freq: Once | INTRAVENOUS | Status: DC | PRN

## 2023-07-20 MED ORDER — LIDOCAINE HCL (CARDIAC) PF 100 MG/5ML IV SOSY
PREFILLED_SYRINGE | INTRAVENOUS | Status: DC | PRN
  Administered 2023-07-20: 40 mg via INTRATRACHEAL

## 2023-07-20 MED ORDER — PROPOFOL 10 MG/ML IV BOLUS
INTRAVENOUS | Status: DC | PRN
Start: 2023-07-20 — End: 2023-07-20
  Administered 2023-07-20: 200 mg via INTRAVENOUS
  Administered 2023-07-20 (×2): 100 mg via INTRAVENOUS

## 2023-07-20 SURGICAL SUPPLY — 78 items
ANCHOR AUTOGRAFT GRAFTLINK CP2 (Anchor) IMPLANT
ANCHOR BUTTON TIGHTROPE 14 (Anchor) IMPLANT
BANDAGE ESMARK 6X9 LF (GAUZE/BANDAGES/DRESSINGS) IMPLANT
BLADE SHAVER TORPEDO 4X13 (MISCELLANEOUS) ×1 IMPLANT
BLADE SURG 15 STRL LF DISP TIS (BLADE) ×2 IMPLANT
BLADE SURG 15 STRL SS (BLADE) ×2
BNDG CMPR 6 X 5 YARDS HK CLSR (GAUZE/BANDAGES/DRESSINGS) ×1
BNDG CMPR 9X6 STRL LF SNTH (GAUZE/BANDAGES/DRESSINGS)
BNDG ELASTIC 6INX 5YD STR LF (GAUZE/BANDAGES/DRESSINGS) ×1 IMPLANT
BNDG ESMARK 6X9 LF (GAUZE/BANDAGES/DRESSINGS)
BURR OVAL 8 FLU 4.0X13 (MISCELLANEOUS) ×1 IMPLANT
BURR OVAL 8 FLU 5.0X13 (MISCELLANEOUS) IMPLANT
CLSR STERI-STRIP ANTIMIC 1/2X4 (GAUZE/BANDAGES/DRESSINGS) ×1 IMPLANT
COOLER ICEMAN CLASSIC (MISCELLANEOUS) ×1 IMPLANT
COVER BACK TABLE 60X90IN (DRAPES) ×1 IMPLANT
CUFF TOURN SGL QUICK 34 (TOURNIQUET CUFF) ×2
CUFF TRNQT CYL 34X4.125X (TOURNIQUET CUFF) ×1 IMPLANT
CUFF TRNQT CYL 34X4X40X1 (TOURNIQUET CUFF) ×1 IMPLANT
CUTTER BONE 4.0MM X 13CM (MISCELLANEOUS) ×1 IMPLANT
CUTTER TENSIONER SUT 2-0 0 FBW (INSTRUMENTS) IMPLANT
DRAPE INCISE IOBAN 66X45 STRL (DRAPES) IMPLANT
DRAPE U-SHAPE 47X51 STRL (DRAPES) ×1 IMPLANT
DRAPE-T ARTHROSCOPY W/POUCH (DRAPES) ×1 IMPLANT
DRILL FLIPCUTTER III 6-12 (ORTHOPEDIC DISPOSABLE SUPPLIES) IMPLANT
DURAPREP 26ML APPLICATOR (WOUND CARE) ×1 IMPLANT
ELECT REM PT RETURN 9FT ADLT (ELECTROSURGICAL) ×1
ELECTRODE REM PT RTRN 9FT ADLT (ELECTROSURGICAL) ×1 IMPLANT
FIBERSTICK 2 (SUTURE) IMPLANT
FLIPCUTTER III 6-12 AR-1204FF (ORTHOPEDIC DISPOSABLE SUPPLIES)
GAUZE PAD ABD 8X10 STRL (GAUZE/BANDAGES/DRESSINGS) ×1 IMPLANT
GAUZE SPONGE 4X4 12PLY STRL (GAUZE/BANDAGES/DRESSINGS) ×1 IMPLANT
GAUZE XEROFORM 1X8 LF (GAUZE/BANDAGES/DRESSINGS) ×1 IMPLANT
GLOVE BIO SURGEON STRL SZ7.5 (GLOVE) ×2 IMPLANT
GLOVE BIOGEL PI IND STRL 8 (GLOVE) ×2 IMPLANT
GOWN STRL REUS W/ TWL LRG LVL3 (GOWN DISPOSABLE) ×1 IMPLANT
GOWN STRL REUS W/ TWL XL LVL3 (GOWN DISPOSABLE) ×1 IMPLANT
GOWN STRL REUS W/TWL LRG LVL3 (GOWN DISPOSABLE) ×1
GOWN STRL REUS W/TWL XL LVL3 (GOWN DISPOSABLE) ×3 IMPLANT
IMP SYS 2ND FIX PEEK 4.75X19.1 (Miscellaneous) ×1 IMPLANT
IMPL SYS 2ND FX PEEK 4.75X19.1 (Miscellaneous) IMPLANT
KNIFE GRAFT ACL 10MM 5952 (MISCELLANEOUS) IMPLANT
KNIFE GRAFT ACL 9MM (MISCELLANEOUS) IMPLANT
MANIFOLD NEPTUNE II (INSTRUMENTS) ×1 IMPLANT
NDL SUT 2-0 SCORPION KNEE (NEEDLE) IMPLANT
NEEDLE SUT 2-0 SCORPION KNEE (NEEDLE)
PACK ARTHROSCOPY DSU (CUSTOM PROCEDURE TRAY) ×1 IMPLANT
PACK BASIN DAY SURGERY FS (CUSTOM PROCEDURE TRAY) ×1 IMPLANT
PAD COLD SHLDR WRAP-ON (PAD) ×1 IMPLANT
PENCIL SMOKE EVACUATOR (MISCELLANEOUS) IMPLANT
SHEET MEDIUM DRAPE 40X70 STRL (DRAPES) ×1 IMPLANT
SLEEVE SCD COMPRESS KNEE MED (STOCKING) ×1 IMPLANT
SPONGE T-LAP 4X18 ~~LOC~~+RFID (SPONGE) ×1 IMPLANT
SUCTION TUBE FRAZIER 10FR DISP (SUCTIONS) ×1 IMPLANT
SUT 2 FIBERLOOP 20 STRT BLUE (SUTURE)
SUT ETHILON 4 0 PS 2 18 (SUTURE) ×1 IMPLANT
SUT FIBERWIRE #2 38 REV NDL BL (SUTURE)
SUT FIBERWIRE #2 38 T-5 BLUE (SUTURE)
SUT MNCRL AB 3-0 PS2 18 (SUTURE) ×1 IMPLANT
SUT MON AB 2-0 CT1 36 (SUTURE) ×1 IMPLANT
SUT PDS AB 0 CT 36 (SUTURE) IMPLANT
SUT VIC AB 0 CT1 27 (SUTURE) ×3
SUT VIC AB 0 CT1 27XBRD ANBCTR (SUTURE) IMPLANT
SUT VIC AB 1 CT1 27 (SUTURE)
SUT VIC AB 1 CT1 27XBRD ANBCTR (SUTURE) IMPLANT
SUT VIC AB 2-0 CT1 27 (SUTURE) ×1
SUT VIC AB 2-0 CT1 TAPERPNT 27 (SUTURE) ×1 IMPLANT
SUT VICRYL 0 UR6 27IN ABS (SUTURE) IMPLANT
SUTURE 2 FIBERLOOP 20 STRT BLU (SUTURE) IMPLANT
SUTURE FIBERWR #2 38 T-5 BLUE (SUTURE) IMPLANT
SUTURE FIBERWR#2 38 REV NDL BL (SUTURE) IMPLANT
SUTURE TAPE TIGERLINK 1.3MM BL (SUTURE) IMPLANT
SUTURETAPE TIGERLINK 1.3MM BL (SUTURE)
TAPE LABRALWHITE 1.5X36 (TAPE) IMPLANT
TOWEL GREEN STERILE FF (TOWEL DISPOSABLE) ×2 IMPLANT
TUBE CONNECTING 20X1/4 (TUBING) IMPLANT
TUBE SUCTION HIGH CAP CLEAR NV (SUCTIONS) ×1 IMPLANT
TUBING ARTHROSCOPY IRRIG 16FT (MISCELLANEOUS) ×1 IMPLANT
WAND ABLATOR APOLLO I90 (BUR) IMPLANT
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
## 2023-07-20 NOTE — Anesthesia Preprocedure Evaluation (Addendum)
Anesthesia Evaluation  Patient identified by MRN, date of birth, ID band Patient awake    Reviewed: Allergy & Precautions, NPO status , Patient's Chart, lab work & pertinent test results  Airway Mallampati: II  TM Distance: >3 FB Neck ROM: Full    Dental  (+) Teeth Intact, Dental Advisory Given   Pulmonary asthma , Current Smoker and Patient abstained from smoking.   breath sounds clear to auscultation       Cardiovascular negative cardio ROS  Rhythm:Regular Rate:Normal     Neuro/Psych negative neurological ROS  negative psych ROS   GI/Hepatic negative GI ROS, Neg liver ROS,,,  Endo/Other  negative endocrine ROS    Renal/GU negative Renal ROS     Musculoskeletal negative musculoskeletal ROS (+)    Abdominal   Peds  Hematology negative hematology ROS (+)   Anesthesia Other Findings   Reproductive/Obstetrics                             Anesthesia Physical Anesthesia Plan  ASA: 2  Anesthesia Plan: General   Post-op Pain Management: Regional block*, Tylenol PO (pre-op)* and Toradol IV (intra-op)*   Induction: Intravenous  PONV Risk Score and Plan: 2 and Ondansetron, Dexamethasone and Midazolam  Airway Management Planned: LMA  Additional Equipment: None  Intra-op Plan:   Post-operative Plan: Extubation in OR  Informed Consent: I have reviewed the patients History and Physical, chart, labs and discussed the procedure including the risks, benefits and alternatives for the proposed anesthesia with the patient or authorized representative who has indicated his/her understanding and acceptance.     Dental advisory given  Plan Discussed with: CRNA  Anesthesia Plan Comments:        Anesthesia Quick Evaluation

## 2023-07-20 NOTE — Transfer of Care (Signed)
Immediate Anesthesia Transfer of Care Note  Patient: Anthony Vance  Procedure(s) Performed: KNEE ARTHROSCOPY WITH ANTERIOR CRUCIATE LIGAMENT (ACL) RECONSTRUCTION WITH HAMSTRING AUTOGRAFT (Right: Knee) KNEE ARTHROSCOPY WITH MEDIAL AND LATERAL MENISECTOMY (Right: Knee)  Patient Location: PACU  Anesthesia Type:General and Regional  Level of Consciousness: drowsy and pateint uncooperative  Airway & Oxygen Therapy: Patient Spontanous Breathing and Patient connected to face mask oxygen  Post-op Assessment: Report given to RN, Patient moving all extremities, and Patient moving all extremities X 4  Post vital signs: Reviewed and stable  Last Vitals:  Vitals Value Taken Time  BP 137/88 07/20/23 1128  Temp 36.2 C 07/20/23 1127  Pulse 84 07/20/23 1129  Resp 14 07/20/23 1129  SpO2 98 % 07/20/23 1129  Vitals shown include unfiled device data.  Last Pain:  Vitals:   07/20/23 1127  TempSrc:   PainSc: Asleep         Complications: No notable events documented.

## 2023-07-20 NOTE — Discharge Instructions (Addendum)
No tylenol until 2:15 p.m. No ibuprofen until 5:30 p.m.   DISCHARGE INSTRUCTIONS: ________________________________________________________________________________ ACL RECONSTRUCTION HOME EXERCISE PROGRAM (0-2 WEEKS)   Elevate the leg above your heart as often as possible. Weight bear as tolerated with the Bledsoe brace, use crutches as needed, progress from 2 to 1 crutch as able using one crutch on opposite side of surgical knee.  Do not limp and do not walk too much!! You should sleep in the knee brace with it locked in full extension.  He may remove for showering.  Otherwise he may remove for exercise. Start normal showering on postoperative day #3.  Do not submerge underwater Goals for first two weeks:  minimal swelling, motion 0-90, walking with brace without crutches and positive attitude about PT. Use pain medication as needed.  You may also take Tylenol and Advil around-the-clock in alternating fashion in addition to the pain medication.  To prevent constipation use Colace 100mg . twice a day while on pain medication.  If constipated, use Miralax 17 gm once a day and drink plenty of fluids.  These medications can be obtained at the pharmacy without a prescription.   Follow up in the office in 14 days. You may remove your postoperative bandages on the third day from surgery and begin showering.  Do not remove the Steri-Strips.  Do not submerge underwater.  Replace your Ace bandage over your wounds before reapplying your knee brace You should also continue to wear the TED hose for 2 weeks postoperatively. You should also take an 81 mg aspirin once per day x 6 weeks for the prevention of DVT.  Regional Anesthesia Blocks  1. You may not be able to move or feel the "blocked" extremity after a regional anesthetic block. This may last may last from 3-48 hours after placement, but it will go away. The length of time depends on the medication injected and your individual response to the medication.  As the nerves start to wake up, you may experience tingling as the movement and feeling returns to your extremity. If the numbness and inability to move your extremity has not gone away after 48 hours, please call your surgeon.   2. The extremity that is blocked will need to be protected until the numbness is gone and the strength has returned. Because you cannot feel it, you will need to take extra care to avoid injury. Because it may be weak, you may have difficulty moving it or using it. You may not know what position it is in without looking at it while the block is in effect.  3. For blocks in the legs and feet, returning to weight bearing and walking needs to be done carefully. You will need to wait until the numbness is entirely gone and the strength has returned. You should be able to move your leg and foot normally before you try and bear weight or walk. You will need someone to be with you when you first try to ensure you do not fall and possibly risk injury.  4. Bruising and tenderness at the needle site are common side effects and will resolve in a few days.  5. Persistent numbness or new problems with movement should be communicated to the surgeon or the [facility] [phone] [facility] [phone]). Post Anesthesia Home Care Instructions  Activity: Get plenty of rest for the remainder of the day. A responsible individual must stay with you for 24 hours following the procedure.  For the next  24 hours, DO NOT: -Drive a car -Advertising copywriter -Drink alcoholic beverages -Take any medication unless instructed by your physician -Make any legal decisions or sign important papers.  Meals: Start with liquid foods such as gelatin or soup. Progress to regular foods as tolerated. Avoid greasy, spicy, heavy foods. If nausea and/or vomiting occur, drink only clear liquids until the nausea and/or vomiting subsides. Call your physician if vomiting  continues.  Special Instructions/Symptoms: Your throat may feel dry or sore from the anesthesia or the breathing tube placed in your throat during surgery. If this causes discomfort, gargle with warm salt water. The discomfort should disappear within 24 hours.  If you had a scopolamine patch placed behind your ear for the management of post- operative nausea and/or vomiting:  1. The medication in the patch is effective for 72 hours, after which it should be removed.  Wrap patch in a tissue and discard in the trash. Wash hands thoroughly with soap and water. 2. You may remove the patch earlier than 72 hours if you experience unpleasant side effects which may include dry mouth, dizziness or visual disturbances. 3. Avoid touching the patch. Wash your hands with soap and water after contact with the patch.

## 2023-07-20 NOTE — Anesthesia Procedure Notes (Signed)
Procedure Name: LMA Insertion Date/Time: 07/20/2023 9:35 AM  Performed by: Salomon Mast, CRNAPre-anesthesia Checklist: Patient identified, Emergency Drugs available, Suction available and Patient being monitored Patient Re-evaluated:Patient Re-evaluated prior to induction Oxygen Delivery Method: Circle system utilized Preoxygenation: Pre-oxygenation with 100% oxygen Induction Type: IV induction Ventilation: Mask ventilation without difficulty LMA: LMA inserted LMA Size: 4.0 Number of attempts: 1 Placement Confirmation: positive ETCO2 and breath sounds checked- equal and bilateral Tube secured with: Tape

## 2023-07-20 NOTE — Progress Notes (Signed)
Instructed pt & family that next dose of Oxycodone due at 4:30 pm if needed today.

## 2023-07-20 NOTE — Op Note (Signed)
Surgery Date: @DATE @    Surgeon(s): Aundria Rud, Noah Delaine, MD   ASSIST: Dion Saucier, PA-C  Assistant attestation:  PA Mcclung was present for the entire procedure.   Implants:  Arthrex all inside cortical buttons on femur and tibia 4.75 PEEK swivel lock x 1.   ANESTHESIA:  general, and adductor block   IV FLUIDS AND URINE: See anesthesia.   TOURNIQUET: 80 minutes    DRAINS: none   COMPLICATIONS: None.     ESTIMATED BLOOD LOSS: minimal   PREOPERATIVE DIAGNOSES:  1.  Right knee medial and lateral meniscus tear 2.  Right knee complete ACL rupture   POSTOPERATIVE DIAGNOSES:  same   PROCEDURES PERFORMED:  1.  Right knee arthroscopy with Hamstring autograft ACL reconstruction 2.  Right knee arthroscopic partial lateral and medial meniscectomy     DESCRIPTION OF PROCEDURE:  Anthony Vance is a pleasant 22 year old male here today with right knee Lateral meniscus tear, Complete ACL rupture, partial MCL tear, complex medial meniscus tear.  They sustained these injuries about 2 months prior to coming to the operating room today.  After a short period of prehabilitation to allow for return of ROM and quadriceps strenght, we discussed proceeding with arthroscopically assisted hamstring autograft ACL reconstruction and possible meniscectomy versus repair.  We reviewed the risks benefits and indications of this procedure including but not limited to bleeding, infection, damage to neurovascular structures, need for future surgery, developed an of arthrosis, rupture of graft, continued instability of the knee, and developement of blood clots and risk of anesthesia.  All questions answered.   The patient was identified in the preoperative holding area and the operative extremity was marked. The patient was brought to the operating room and transferred to operating table in a supine position. Satisfactory general anesthesia was induced by anesthesiology.     Examination under  anesthesia revealed a grade 2B Lachman, grade 2 pivot shift, and stable to varus and valgus stress.    The procedure was initiated by obtaining a hamstring graft. An Esmarch was used to wrap out the leg and the tourniquet was raised for a short time. A 3-inch incision was created over the pes anserine. Dissection was carried out to the level of the sartorius, which was divided above the gracilis tendon, then everted to reveal the semitendinosus tendon which was released distally, tagged and stripped per usual.   The sartorius and gracilis were then repaired back to their insertion with heavy Vicryl suture, that was later incorporated into the swivel lock anchor.   At the back table, we next prepared the graft by removing muscle and tenosynovium. The semitendinosis graft were was cut to length and quadrupled, looping around the Arthrex ACL TightRope for the femoral side and ABS tightrope for the tibial side. The two free ends of the autograft were secured to each other at the tibial side with #2 interlocking FiberWire sutures.  The remainder of the graft was then circumferentially secured according to the manufacturer's recommendations with a 0 FiberWire suture twice at the tibial side and once at the femoral side. The graft was pretensioned on the back table and wrapped in a saline-soaked gauze.  The graft measured 70 mm in total length, 9.5 mm on femoral tunnel diameter, and 9.5 mm diameter on the tibial tunnel.   Standard anterolateral, anteromedial arthroscopy portals were obtained. The anteromedial portal was obtained with a spinal needle for localization under direct visualization with subsequent diagnostic findings.    Anteromedial and anterolateral chambers: mild  synovitis. The synovitis was debrided with a 4.5 mm full radius shaver through both the anteromedial and lateral portals.    Suprapatellar pouch and gutters: no synovitis or debris. Patella chondral surface: Grade 0 Trochlear chondral  surface: Grade 0 Patellofemoral tracking: Midline, no tilt Medial meniscus: Complex tear with oblique tear of the posterior horn.  The posterior root was attached just deep to that oblique tear which did involve about 50% of the posterior root.  This propagated around to the junction of the middle and posterior third of the meniscus.  There was also unstable horizontal tear noted on the residual posterior horn.  This oblique/parrot-beak tear did not go all the way across to the capsule.  Surface Medial femoral condyle flexion bearing surface: Grade 0 Medial femoral condyle extension bearing surface: Grade 0 Medial tibial plateau: Grade 0 Anterior cruciate ligament:Complete mid substance tear Posterior cruciate ligament:stable Lateral meniscus: White zone posterior horn radial tear..   Lateral femoral condyle flexion bearing surface: Grade 0 Lateral femoral condyle extension bearing surface: Just at the level of the sulcus terminalis and perhaps a bit more distal along the weightbearing axis there was a area of approximately 2.5 x 2.5 cm of impaction injury with some good fibrocartilage healing.  This appeared to have been full-thickness probably at the time of the injury but was healing over nicely. Lateral tibial plateau: Grade 0   Next, We turned our attention to the medial meniscus tear.  Utilizing combination of meniscal biter as well as motorized shaver we remove the flat fragment of this parrot-beak tear and then utilized the biter to also remove the tibial side of the horizontal tear posterior horn.  We then smoothed this with motorized shaver.   The lateral meniscus was inspected closely and found to have a white zone radial tear about 2 mm lateral to the posterior root attachment.  This did not complete through the entire meniscus and did not propagate to the capsule attachment.  There was a unstable piece of meniscal tissue that was debrided back with the motorized shaver until it was  stable.  This completed the lateral partial meniscectomy.  We did also perform a chondroplasty of the lateral tibial plateau underneath this tear.  This was performed utilizing the motorized shaver as well.   Next, the ACL reconstruction was undertaken. The ACL stump was removed with thermal ablation and shaver and anatomic bony landmarks were marked for the placement of the femoral and tibial sockets.  Arthrex retroguides and Flipcutters were used to create the sockets and perform the procedure by an all-inside GraftLink technique.  The femoral socket was created at the inferior portion of the bifurcate ridge of the lateral femoral wall with a size 9.5 mm FlipCutter to a depth of  15 mm while the tibial socket was created at the center of the ACL footprint from front to back and toward the base of the medial tibial eminence from medial to lateral, to a depth of 23-25 mm with a 9.5 mm FlipCutter. Bony debris was removed and the edges of socket apertures were smoothed. Suture shuttles were used to deliver the graft into the femoral socket first and the tibial socket second. The graft was then secured within the sockets, cinching the self-locking sutures overtop of the proximal and distal cortical buttons with the knee in a reduced position maintained at 20 degrees flexion while a moderate force posterior drawer was applied.  After this preliminary tensioning, the knee was placed through several flexion-extension cycles  to eliminate any graft settling or excursion and the graft was re-tensioned in the same manner and the sutures were tied over top of the buttons proximally and distally, and the four tibial sided suture arms were secondarily secured at the proximal tibia with 1 SwiveLock anchor. Of note we did also pass a free labral tape through the ACL fixation as an separate internal brace backup fixation.   Final images of the ACL graft were obtained, revealing no lateral wall or roof impingement of the graft at  the notch through range of motion.  Stability of the ACL graft was assessed and found to be normalized at grade 0 lachman and grade 0 pivot shift.    The wounds were all closed in layers per usual.  Dressings were applied and a brace placed And locked in 0 of flexion..  There were no apparent complications.  The patient was awakened and taken to recovery room in satisfactory condition.   POSTOPERATIVE PLAN:  Dmario Gangwer will be touch down weight bearing on crutches until cleared by The therapist.  They will likewise be in The knee brace with it locked until quad function is normalized. They will be on 81 mg asa daily for 6 weeks for DVT PPX.  they will return to the clinic to see the surgeon in 2 weeks.   Anthony Vance

## 2023-07-20 NOTE — Brief Op Note (Signed)
07/20/2023  11:06 AM  PATIENT:  Anthony Vance  22 y.o. male  PRE-OPERATIVE DIAGNOSIS:  Right knee anterior cruciate ligament tear, medial and lateral meniscus tear  POST-OPERATIVE DIAGNOSIS:  Right knee anterior cruciate ligament tear, medial and lateral meniscus tear  PROCEDURE:  Procedure(s): KNEE ARTHROSCOPY WITH ANTERIOR CRUCIATE LIGAMENT (ACL) RECONSTRUCTION WITH HAMSTRING AUTOGRAFT (Right) KNEE ARTHROSCOPY WITH MEDIAL AND LATERAL MENISECTOMY (Right)  SURGEON:  Surgeons and Role:    * Yolonda Kida, MD - Primary  PHYSICIAN ASSISTANT: Dion Saucier, PA-C   ANESTHESIA:   regional and general  EBL:  15 cc  BLOOD ADMINISTERED:none  DRAINS: none   LOCAL MEDICATIONS USED:  NONE  SPECIMEN:  No Specimen  DISPOSITION OF SPECIMEN:  N/A  COUNTS:  YES  TOURNIQUET:  * Missing tourniquet times found for documented tourniquets in log: [id number] *  DICTATION: .Note written in EPIC  PLAN OF CARE: Discharge to home after PACU  PATIENT DISPOSITION:  PACU - hemodynamically stable.   Delay start of Pharmacological VTE agent (>24hrs) due to surgical blood loss or risk of bleeding: not applicable

## 2023-07-20 NOTE — Progress Notes (Signed)
Assisted Dr. Hart Rochester with right, adductor canal, ultrasound guided block. Side rails up, monitors on throughout procedure. See vital signs in flow sheet. Tolerated Procedure well.

## 2023-07-20 NOTE — Anesthesia Procedure Notes (Signed)
Anesthesia Regional Block: Adductor canal block   Pre-Anesthetic Checklist: , timeout performed,  Correct Patient, Correct Site, Correct Laterality,  Correct Procedure, Correct Position, site marked,  Risks and benefits discussed,  Surgical consent,  Pre-op evaluation,  At surgeon's request and post-op pain management  Laterality: Right  Prep: chloraprep       Needles:  Injection technique: Single-shot  Needle Type: Echogenic Stimulator Needle     Needle Length: 9cm  Needle Gauge: 21     Additional Needles:   Procedures:,,,, ultrasound used (permanent image in chart),,    Narrative:  Start time: 07/20/2023 8:45 AM End time: 07/20/2023 8:50 AM Injection made incrementally with aspirations every 5 mL.  Performed by: Personally  Anesthesiologist: Shelton Silvas, MD  Additional Notes: Discussed risks and benefits of the nerve block in detail, including but not limited vascular injury, permanent nerve damage and infection.   Patient tolerated the procedure well. Local anesthetic introduced in an incremental fashion under minimal resistance after negative aspirations. No paresthesias were elicited. After completion of the procedure, no acute issues were identified and patient continued to be monitored by RN.

## 2023-07-20 NOTE — Anesthesia Postprocedure Evaluation (Signed)
Anesthesia Post Note  Patient: Anthony Vance  Procedure(s) Performed: KNEE ARTHROSCOPY WITH ANTERIOR CRUCIATE LIGAMENT (ACL) RECONSTRUCTION WITH HAMSTRING AUTOGRAFT (Right: Knee) KNEE ARTHROSCOPY WITH MEDIAL AND LATERAL MENISECTOMY (Right: Knee)     Patient location during evaluation: PACU Anesthesia Type: Regional and General Level of consciousness: awake and alert Pain management: pain level controlled Vital Signs Assessment: post-procedure vital signs reviewed and stable Respiratory status: spontaneous breathing, nonlabored ventilation, respiratory function stable and patient connected to nasal cannula oxygen Cardiovascular status: blood pressure returned to baseline and stable Postop Assessment: no apparent nausea or vomiting Anesthetic complications: no   No notable events documented.  Last Vitals:  Vitals:   07/20/23 1225 07/20/23 1300  BP: (!) 179/108 (!) 175/105  Pulse: 60   Resp: 18   Temp: 36.6 C   SpO2: 98%                 Shelton Silvas

## 2023-07-23 ENCOUNTER — Encounter (HOSPITAL_BASED_OUTPATIENT_CLINIC_OR_DEPARTMENT_OTHER): Payer: Self-pay | Admitting: Orthopedic Surgery

## 2024-07-18 ENCOUNTER — Other Ambulatory Visit (HOSPITAL_BASED_OUTPATIENT_CLINIC_OR_DEPARTMENT_OTHER): Payer: Self-pay

## 2024-07-18 ENCOUNTER — Emergency Department (HOSPITAL_BASED_OUTPATIENT_CLINIC_OR_DEPARTMENT_OTHER)
Admission: EM | Admit: 2024-07-18 | Discharge: 2024-07-18 | Disposition: A | Discharge status: Home / Self Care | Attending: Emergency Medicine | Admitting: Emergency Medicine

## 2024-07-18 ENCOUNTER — Other Ambulatory Visit: Payer: Self-pay

## 2024-07-18 DIAGNOSIS — R1030 Lower abdominal pain, unspecified: Secondary | ICD-10-CM | POA: Diagnosis present

## 2024-07-18 DIAGNOSIS — L293 Anogenital pruritus, unspecified: Secondary | ICD-10-CM

## 2024-07-18 DIAGNOSIS — L290 Pruritus ani: Secondary | ICD-10-CM | POA: Insufficient documentation

## 2024-07-18 MED ORDER — DOXYCYCLINE HYCLATE 100 MG PO CAPS
100.0000 mg | ORAL_CAPSULE | Freq: Two times a day (BID) | ORAL | 0 refills | Status: AC
  Filled 2024-07-18: qty 14, 7d supply, fill #0

## 2024-07-18 NOTE — Discharge Instructions (Signed)
 Please read and follow all provided instructions.  Your diagnoses today include:  1. Perineal irritation     Tests performed today include: Vital signs. See below for your results today.   Medications prescribed:  Doxycycline  - antibiotic  You have been prescribed an antibiotic medicine: take the entire course of medicine even if you are feeling better. Stopping early can cause the antibiotic not to work.  Take any prescribed medications only as directed.   Home care instructions:  Do soaks in warm water at least twice a day for 15 or 20 minutes.  Use an over-the-counter ointment like A&E ointment or Vaseline to keep the area lubricated while it is healing.   Follow-up instructions: Return to the Emergency Department in 48 hours for a recheck if your symptoms are not significantly improved.   Return instructions:  Return to the Emergency Department if you have: Fever Worsening symptoms Worsening pain Worsening swelling Redness of the skin that moves away from the affected area, especially if it streaks away from the affected area  Any other emergent concerns  Your vital signs today were: BP 137/72   Pulse 92   Temp 98.1 F (36.7 C) (Oral)   Resp 16   SpO2 97%  If your blood pressure (BP) was elevated above 135/85 this visit, please have this repeated by your doctor within one month. --------------

## 2024-07-18 NOTE — ED Triage Notes (Signed)
 Patient states ingrown hair near scrotum for the past week.

## 2024-07-18 NOTE — ED Provider Notes (Signed)
 [place] EMERGENCY DEPARTMENT AT [facility] Provider Note   CSN: [id number] Arrival date & time: 07/18/24  9097     Patient presents with: Groin Pain   Anthony Vance is a 23 y.o. male.   Patient with no pertinent past medical history presents today for evaluation of groin pain.  Patient reports having a ingrown hair in the groin, underneath the scrotum.  He does report some chafing of the skin inside of his legs.  States that he has gained some weight recently.  He states that he started feeling pain about a week ago.  It is interfering with walking currently.  He states that it feels like the skin is raw.  No fevers.  Denies history of diabetes or other immunocompromise.  He has never had a boil that has needed drainage before.  Patient states that he is having normal urination and bowel movements.       Prior to Admission medications   Medication Sig Start Date End Date Taking? Authorizing Provider  doxycycline  (VIBRAMYCIN ) 100 MG capsule Take 1 capsule (100 mg total) by mouth 2 (two) times daily. 07/18/24  Yes Ibeth Fahmy, PA-C  ibuprofen  (ADVIL ) 800 MG tablet Take 1 tablet (800 mg total) by mouth 3 (three) times daily. 02/09/21   Mesner, Selinda, MD  ondansetron  (ZOFRAN ) 4 MG tablet Take 1 tablet (4 mg total) by mouth every 8 (eight) hours as needed for nausea or vomiting. 07/20/23   Sharl Selinda Dover, MD  oxyCODONE  (ROXICODONE ) 5 MG immediate release tablet Take 1 tablet (5 mg total) by mouth every 4 (four) hours as needed for severe pain or moderate pain. 07/20/23 07/19/24  Sharl Selinda Dover, MD  fluticasone  (FLONASE ) 50 MCG/ACT nasal spray Place 2 sprays into both nostrils daily. 10/08/18 05/19/20  Babara Greig GAILS, PA-C    Allergies: Patient has no known allergies.    Review of Systems  Updated Vital Signs BP 137/72   Pulse 92   Temp 98.1 F (36.7 C) (Oral)   Resp 16   SpO2 97%   Physical Exam Vitals and nursing note reviewed.  Constitutional:       Appearance: He is well-developed.  HENT:     Head: Normocephalic and atraumatic.  Eyes:     Conjunctiva/sclera: Conjunctivae normal.  Pulmonary:     Effort: No respiratory distress.  Genitourinary:    Comments: Patient with some erythema without significant warmth, no identifiable drainage, or abscess in the perineal area.  Area is very tender to palpation.  No crepitus.  Clinically area is not consistent with Fournier's gangrene.  I do not appreciate any induration or fluctuance. Musculoskeletal:     Cervical back: Normal range of motion and neck supple.  Skin:    General: Skin is warm and dry.  Neurological:     Mental Status: He is alert.     (all labs ordered are listed, but only abnormal results are displayed) Labs Reviewed - No data to display  EKG: None  Radiology: No results found.   Procedures   Medications Ordered in the ED - No data to display  ED Course  Patient seen and examined. History obtained directly from patient. Work-up including labs, imaging, EKG ordered in triage, if performed, were reviewed.    Labs/EKG: None ordered  Imaging: None ordered  Medications/Fluids: Ordered: None ordered  Most recent vital signs reviewed and are as follows: BP 137/72   Pulse 92   Temp 98.1 F (36.7 C) (Oral)   Resp 16  SpO2 97%   Initial impression: Irritation in the perineal area, possible mild cellulitis, no abscess.  Home treatment plan: Warm soaks, topical barrier for lubrication, doxycycline  for possible mild cellulitis given amount of pain  Return instructions discussed with patient: We discussed that symptoms can progress and worsen and if he develops any swelling, drainage, especially if it is spreading and then a larger area, he should return to the emergency department for evaluation.    Follow-up instructions discussed with patient: Encouraged follow-up in 48 to 72 hours for recheck if not improving.                                   Medical  Decision Making Risk Prescription drug management.   Patient with perineal pain.  There is some irritation that appears most consistent with chafing in the perineal area posterior to the scrotum.  No scrotal involvement.  I do not appreciate any abscess, induration or fluctuance.  I do not see anything that is drainable currently.  Given the inflammation, will cover with a trial of doxycycline .  Patient's discomfort is somewhat out of proportion to physical exam, however he is not in any distress.  Patient does not have risk factors for Fournier's gangrene.  No abnormal vital signs today.  I do not feel that he requires advanced imaging at this time given overall well appearance.  He does seem reliable to return with any worsening.     Final diagnoses:  Perineal irritation    ED Discharge Orders          Ordered    doxycycline  (VIBRAMYCIN ) 100 MG capsule  2 times daily        07/18/24 0932               Desiderio Chew, PA-C 07/18/24 9061    Levander Houston, MD 07/21/24 [phone]

## 2024-09-17 ENCOUNTER — Encounter (HOSPITAL_BASED_OUTPATIENT_CLINIC_OR_DEPARTMENT_OTHER): Payer: Self-pay | Admitting: Emergency Medicine

## 2024-09-17 ENCOUNTER — Emergency Department (HOSPITAL_BASED_OUTPATIENT_CLINIC_OR_DEPARTMENT_OTHER)

## 2024-09-17 ENCOUNTER — Emergency Department (HOSPITAL_BASED_OUTPATIENT_CLINIC_OR_DEPARTMENT_OTHER)
Admission: EM | Admit: 2024-09-17 | Discharge: 2024-09-17 | Disposition: A | Discharge status: Home / Self Care | Attending: Emergency Medicine | Admitting: Emergency Medicine

## 2024-09-17 ENCOUNTER — Other Ambulatory Visit: Payer: Self-pay

## 2024-09-17 ENCOUNTER — Other Ambulatory Visit (HOSPITAL_BASED_OUTPATIENT_CLINIC_OR_DEPARTMENT_OTHER): Payer: Self-pay

## 2024-09-17 DIAGNOSIS — R519 Headache, unspecified: Secondary | ICD-10-CM | POA: Diagnosis not present

## 2024-09-17 DIAGNOSIS — J45909 Unspecified asthma, uncomplicated: Secondary | ICD-10-CM | POA: Insufficient documentation

## 2024-09-17 DIAGNOSIS — Z7951 Long term (current) use of inhaled steroids: Secondary | ICD-10-CM | POA: Diagnosis not present

## 2024-09-17 DIAGNOSIS — R059 Cough, unspecified: Secondary | ICD-10-CM | POA: Diagnosis present

## 2024-09-17 DIAGNOSIS — J9801 Acute bronchospasm: Secondary | ICD-10-CM

## 2024-09-17 LAB — RESP PANEL BY RT-PCR (RSV, FLU A&B, COVID)  RVPGX2
Influenza A by PCR: NEGATIVE
Influenza B by PCR: NEGATIVE
Resp Syncytial Virus by PCR: NEGATIVE
SARS Coronavirus 2 by RT PCR: NEGATIVE

## 2024-09-17 MED ORDER — IPRATROPIUM BROMIDE 0.02 % IN SOLN
0.5000 mg | Freq: Once | RESPIRATORY_TRACT | Status: AC
  Administered 2024-09-17: 0.5 mg via RESPIRATORY_TRACT
  Filled 2024-09-17: qty 2.5

## 2024-09-17 MED ORDER — OXYCODONE-ACETAMINOPHEN 5-325 MG PO TABS
2.0000 | ORAL_TABLET | Freq: Once | ORAL | Status: AC
  Administered 2024-09-17: 2 via ORAL
  Filled 2024-09-17: qty 2

## 2024-09-17 MED ORDER — PREDNISONE 50 MG PO TABS
60.0000 mg | ORAL_TABLET | Freq: Once | ORAL | Status: AC
  Administered 2024-09-17: 60 mg via ORAL
  Filled 2024-09-17: qty 1

## 2024-09-17 MED ORDER — ALBUTEROL (5 MG/ML) CONTINUOUS INHALATION SOLN
10.0000 mg/h | INHALATION_SOLUTION | Freq: Once | RESPIRATORY_TRACT | Status: AC
  Administered 2024-09-17: 10 mg/h via RESPIRATORY_TRACT
  Filled 2024-09-17: qty 20

## 2024-09-17 MED ORDER — ALBUTEROL SULFATE HFA 108 (90 BASE) MCG/ACT IN AERS
2.0000 | INHALATION_SPRAY | RESPIRATORY_TRACT | Status: DC
  Administered 2024-09-17: 2 via RESPIRATORY_TRACT
  Filled 2024-09-17: qty 6.7

## 2024-09-17 MED ORDER — PREDNISONE 50 MG PO TABS
ORAL_TABLET | ORAL | 0 refills | Status: AC
  Filled 2024-09-17: qty 4, 4d supply, fill #0

## 2024-09-17 MED ORDER — ALBUTEROL SULFATE HFA 108 (90 BASE) MCG/ACT IN AERS: 2.0000 | INHALATION_SPRAY | RESPIRATORY_TRACT | Status: DC

## 2024-09-17 NOTE — ED Triage Notes (Signed)
 Pt c/o cough and HA x 3 or 4 days.Difficulty sleeping, denies fever

## 2024-09-17 NOTE — ED Provider Notes (Signed)
 [place] EMERGENCY DEPARTMENT AT [facility] Provider Note   CSN: [id number] Arrival date & time: 09/17/24  9168     Patient presents with: Cough   Mallory Haile is a 23 y.o. male.   23 year old male with prior history of asthma presents with several days of cough with headache.  No fever or chills.  No vomiting or diarrhea.  States that he does not normally use medication for his asthma because he had as a young child.  No recent URI symptoms.  States has had a mild headache due to coughing.  Denies any neck pain or photophobia.  Has endorsed wheezing.  No treatment used prior to arrival       Prior to Admission medications   Medication Sig Start Date End Date Taking? Authorizing Provider  doxycycline  (VIBRAMYCIN ) 100 MG capsule Take 1 capsule (100 mg total) by mouth 2 (two) times daily. 07/18/24   Geiple, Joshua, PA-C  ibuprofen  (ADVIL ) 800 MG tablet Take 1 tablet (800 mg total) by mouth 3 (three) times daily. 02/09/21   Mesner, Selinda, MD  ondansetron  (ZOFRAN ) 4 MG tablet Take 1 tablet (4 mg total) by mouth every 8 (eight) hours as needed for nausea or vomiting. 07/20/23   Sharl Selinda Dover, MD  fluticasone  (FLONASE ) 50 MCG/ACT nasal spray Place 2 sprays into both nostrils daily. 10/08/18 05/19/20  Babara Greig GAILS, PA-C    Allergies: Patient has no known allergies.    Review of Systems  All other systems reviewed and are negative.   Updated Vital Signs BP (!) 156/99   Pulse 77   Temp 98.3 F (36.8 C) (Oral)   Resp 18   Wt 113.4 kg   SpO2 96%   BMI 34.87 kg/m   Physical Exam Vitals and nursing note reviewed.  Constitutional:      General: He is not in acute distress.    Appearance: Normal appearance. He is well-developed. He is not toxic-appearing.  HENT:     Head: Normocephalic and atraumatic.  Eyes:     General: Lids are normal.     Conjunctiva/sclera: Conjunctivae normal.     Pupils: Pupils are equal, round, and reactive to light.  Neck:     Thyroid:  No thyroid mass.     Trachea: No tracheal deviation.  Cardiovascular:     Rate and Rhythm: Normal rate and regular rhythm.     Heart sounds: Normal heart sounds. No murmur heard.    No gallop.  Pulmonary:     Effort: Pulmonary effort is normal. Prolonged expiration present. No accessory muscle usage or respiratory distress.     Breath sounds: No stridor. Examination of the right-lower field reveals wheezing. Examination of the left-lower field reveals wheezing. Wheezing present. No decreased breath sounds, rhonchi or rales.  Abdominal:     General: There is no distension.     Palpations: Abdomen is soft.     Tenderness: There is no abdominal tenderness. There is no rebound.  Musculoskeletal:        General: No tenderness. Normal range of motion.     Cervical back: Normal range of motion and neck supple.  Skin:    General: Skin is warm and dry.     Findings: No abrasion or rash.  Neurological:     Mental Status: He is alert and oriented to person, place, and time. Mental status is at baseline.     GCS: GCS eye subscore is 4. GCS verbal subscore is 5. GCS motor subscore is  6.     Cranial Nerves: No cranial nerve deficit.     Sensory: No sensory deficit.     Motor: Motor function is intact.  Psychiatric:        Attention and Perception: Attention normal.        Speech: Speech normal.        Behavior: Behavior normal.     (all labs ordered are listed, but only abnormal results are displayed) Labs Reviewed  RESP PANEL BY RT-PCR (RSV, FLU A&B, COVID)  [id number]    EKG: None  Radiology: No results found.   Procedures   Medications Ordered in the ED  albuterol  (PROVENTIL ,VENTOLIN ) solution continuous neb (has no administration in time range)  predniSONE  (DELTASONE ) tablet 60 mg (has no administration in time range)  ipratropium (ATROVENT ) nebulizer solution 0.5 mg (has no administration in time range)                                    Medical Decision Making Amount  and/or Complexity of Data Reviewed Radiology: ordered.  Risk Prescription drug management.   Patient with wheezing on arrival here.  Given albuterol  with Atrovent  along with prednisone .  Chest x-ray without acute findings.  Patient's respiratory panel negative.  Patient reassessed after butyryl treatment and feels better.  Suspect bronchitis.  Will discharge home on prednisone  as well as will give albuterol  inhaler to go home with    Final diagnoses:  None    ED Discharge Orders     None          Dasie Faden, MD 09/17/24 1127

## 2025-02-13 ENCOUNTER — Ambulatory Visit: Admitting: Nurse Practitioner
